# Patient Record
Sex: Female | Born: 1955 | Race: White | Hispanic: No | Marital: Single
Health system: Southern US, Community
[De-identification: ages and names within clinical notes are randomized; demographics above are authoritative.]

## PROBLEM LIST (undated history)

## (undated) HISTORY — PX: BREAST BIOPSY: SHX20

---

## 2011-11-08 ENCOUNTER — Inpatient Hospital Stay: Payer: Self-pay | Admitting: Internal Medicine

## 2011-11-08 ENCOUNTER — Ambulatory Visit: Payer: Self-pay | Admitting: Internal Medicine

## 2011-11-08 LAB — BASIC METABOLIC PANEL
Calcium, Total: 8.3 mg/dL — ABNORMAL LOW (ref 8.5–10.1)
Creatinine: 0.5 mg/dL — ABNORMAL LOW (ref 0.60–1.30)
EGFR (Non-African Amer.): >60
Glucose: 93 mg/dL (ref 65–99)
Potassium: 4 mmol/L (ref 3.5–5.1)
Sodium: 135 mmol/L — ABNORMAL LOW (ref 136–145)

## 2011-11-08 LAB — CBC WITH DIFFERENTIAL/PLATELET
Basophil #: 0 10*3/uL (ref 0.0–0.1)
Eosinophil #: 0 10*3/uL (ref 0.0–0.7)
HGB: 14.5 g/dL (ref 12.0–16.0)
Lymphocyte #: 1.7 10*3/uL (ref 1.0–3.6)
Lymphocyte %: 26.9 %
MCHC: 33 g/dL (ref 32.0–36.0)
Neutrophil #: 3.6 10*3/uL (ref 1.4–6.5)
Platelet: 174 10*3/uL (ref 150–440)
RDW: 13 % (ref 11.5–14.5)
WBC: 6.4 10*3/uL (ref 3.6–11.0)

## 2011-11-08 LAB — PRO B NATRIURETIC PEPTIDE: B-Type Natriuretic Peptide: 2937 pg/mL — ABNORMAL HIGH (ref 0–125)

## 2011-11-08 LAB — TROPONIN I: Troponin-I: 0.09 ng/mL — ABNORMAL HIGH

## 2011-11-09 LAB — CK TOTAL AND CKMB (NOT AT ARMC)
CK, Total: 44 U/L (ref 21–215)
CK, Total: 37 U/L (ref 21–215)
CK-MB: 1.4 ng/mL (ref 0.5–3.6)

## 2011-11-09 LAB — TROPONIN I: Troponin-I: 0.03 ng/mL

## 2011-11-12 LAB — EXPECTORATED SPUTUM ASSESSMENT W REFEX TO RESP CULTURE

## 2011-11-13 LAB — CULTURE, BLOOD (SINGLE)

## 2013-02-19 ENCOUNTER — Ambulatory Visit: Payer: Self-pay | Admitting: Family Medicine

## 2014-06-25 ENCOUNTER — Ambulatory Visit: Payer: Self-pay | Admitting: Family Medicine

## 2014-07-16 NOTE — H&P (Signed)
PATIENT NAME:  Shari Merritt, Shari Merritt MR#:  [id number] DATE OF BIRTH:  09/11/1955  DATE OF ADMISSION:  11/08/2011  REFERRING PHYSICIAN: ER physician, Dr. Shaune PollackLord  PRIMARY CARE PHYSICIAN: None  CHIEF COMPLAINT: Shortness of breath.   HISTORY OF PRESENT ILLNESS: The patient is a 59 year old female with extensive history of smoking, no other significant past medical history, who does not have a primary care physician and presents with shortness of breath for the past couple of days. She reports that she has had a cold for several weeks and recently she started having cough, bringing up yellow expectoration. She reports subjective fevers, denies any chest pain or pleuritic-type chest pain. 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 She felt that she had an infection. Therefore, she came to the Emergency Room.   ALLERGIES: Codeine, shellfish. The patient reports significant allergy to shellfish and describes an anaphylactic reaction.   MEDICATIONS:  None.  PAST MEDICAL HISTORY:  None.  PAST SURGICAL HISTORY:  Lumpectomy.   SOCIAL HISTORY: She has smoked 1 pack per day for more than 30 years. Denies any alcohol or drug abuse. She lives with her daughter and sister.   FAMILY HISTORY: Mother had deep vein thrombosis, heart problems, and died of a stroke. Father had lung cancer and skin cancer.   REVIEW OF SYSTEMS: CONSTITUTIONAL: Reports subjective fevers. Denies any fatigue or weakness. EYES: Denies any blurred or double vision. ENT: Denies any tinnitus or ear pain.  RESPIRATORY: Reports cough, shortness of breath.  Denies having chest pain, palpitations, or syncope. GASTROINTESTINAL: Denies any nausea, vomiting, diarrhea, or abdominal pain.  GENITOURINARY: Denies any dysuria or hematuria.  ENDO:  Denies any polyuria or nocturia. HEME/LYMPH: Denies any anemia or easy bruisability. INTEGUMENT: She denies any acne or rash.   MUSCULOSKELETAL: Denies any swelling or gout. NEUROLOGICAL: Denies any numbness or weakness.  PSYCH: Denies any anxiety or depression.   PHYSICAL EXAMINATION:  VITAL SIGNS: Temperature 97.9, heart rate 106, respiratory rate 24, blood pressure 100/64, pulse oximetry 81% on room air.   GENERAL: The patient is a 59 year old Caucasian female, thin built, sitting comfortably in bed, not in acute distress.   HEAD: Atraumatic, normocephalic.   EYES: There is no pallor, icterus, or cyanosis. Pupils equal, round, and reactive to light and accommodation. Extraocular movements intact.   ENT: Wet mucous membranes. No oropharyngeal erythema or thrush.   NECK: Supple. No masses. No JVD. No thyromegaly or lymphadenopathy.   CHEST WALL: No tenderness to palpation. Not using accessory muscles of respiration. No intercostal muscle retractions.   LUNGS: Bilaterally clear to auscultation. Reduced breath sounds. No wheezing or rhonchi. Right-sided basal crepitus.   CARDIOVASCULAR: S1, S2 regular. No murmur, rubs, or gallops.   ABDOMEN: Soft, nontender, nondistended. No guarding or rigidity. No organomegaly. Normal bowel sounds.   SKIN: No rashes or lesions.  PERIPHERIES: No pedal edema. 2+ pedal pulses. No calf tenderness.   MUSCULOSKELETAL: No cyanosis or clubbing.   NEUROLOGIC: Awake, alert, oriented times three. Nonfocal neurological exam. Cranial nerves grossly intact.   PSYCH: Normal mood and affect.   LABORATORY, DIAGNOSTIC, AND RADIOLOGICAL DATA: CT of the chest shows extensive emphysematous changes in both lungs, patchy interstitial density inferiorly in the right middle lobe and the right lower lobe. Findings suggest interstitial type pneumonia. No evidence of congestive heart failure or bulky lymphadenopathy. ABG showed pH of 7.38, CO2 65, O2 53. CBC normal. Complete metabolic panel normal  other than sodium of 135, CO2 35. Troponin 0.09. BNP 2937.   ASSESSMENT AND PLAN: 59 year old female  with extensive history of smoking, no primary care physician, presents with shortness of breath and found to be hypoxic.  1. Possible right-sided pneumonia: The patient reports subjective fevers at home although she is afebrile currently with normal white count. She reports cough with yellow expectoration. We will obtain blood and sputum cultures. Start on empiric antibiotics, nebulizer treatments, and p.r.n. antitussives.    2. Hypoxia, hypercarbia: The patient's CT chest shows extensive emphysematous changes. She has an extensive history of smoking. She has metabolic alkalosis, likely due to chronic respiratory acidosis, which is compensated since her pH is normal. She may have chronic hypoxia due to emphysema. We will provide oxygen supplementation and try and wean off as tolerated.  3. Emphysema seen on CT chest:  The patient does have bilaterally reduced breath sounds. She has an extensive history of smoking.  She will benefit from outpatient pulmonary function tests.  4. Elevated troponin: The patient denies any chest pain or having cardiac problems in the past. We will monitor on telemetry, check serial cardiac enzymes, start on aspirin, and obtain echo.  5. History of deep vein thrombosis: The patient reports that she had a deep vein thrombosis more than 20 years ago when she was on oral contraceptive pills.  She cannot get IV contrast since she has a severe shellfish allergy.  Clinically there is no evidence of any deep vein thrombosis. We will obtain a V/Q scan to rule out pulmonary embolism given severe hypoxia on presentation.  6. Elevated BNP: The patient is clinically not showing any signs of fluid overload/congestive heart failure. We will obtain a 2-D echo.  7. History of smoking: The patient was counseled about cessation for more than three minutes. We will provide her with a nicotine patch.  Discussed with the ED physician, discussed with the patient and her family the plan of care and  management.   TIME SPENT: 75 minutes.    ____________________________ Darrick Meigs, MD sp:bjt D: 11/08/2011 19:30:40 ET T: 11/09/2011 07:56:04 ET JOB#: [id number]  cc: Darrick Meigs, MD, <Dictator> Darrick Meigs MD ELECTRONICALLY SIGNED 11/10/2011 7:07

## 2014-07-16 NOTE — Discharge Summary (Signed)
PATIENT NAME:  Shari Merritt, Shari Merritt MR#:  [id number] DATE OF BIRTH:  1956-01-14  DATE OF ADMISSION:  11/08/2011 DATE OF DISCHARGE:  11/11/2011  DIAGNOSES: 1. Systemic inflammatory response syndrome, possibly due to right-sided pneumonia. 2. Likely chronic obstructive pulmonary disease with hypoxia and hypercarbia.  3. Elevated troponin due to demand ischemia.  4. Remote history of deep venous thrombosis on oral contraceptive medications.  5. Smoking.   DISPOSITION: The patient is being discharged home. Follow up with PCP in 1 to 2 weeks after discharge.   DIET: Regular.   ACTIVITY: As tolerated.   DISCHARGE MEDICATIONS:  1. Oxygen 3L via nasal cannula. 2. Prednisone taper. 3. Aspirin 81 mg daily.  4. Albuterol 90 mcg inhaled q.6 hours p.r.n.  5. DuoNeb q.6 hours p.r.n.  6. Advair 250/50 one puff b.i.d.  7. Spiriva 18 mcg inhaled daily.  8. Doxycycline 100 mg b.i.d. for 7 days. 9. Robitussin-DM 10 mL q.6h. p.r.n. cough.   RESULTS: V/Q scan: Low probability for PE. CT chest: Extensive emphysematous changes in both lungs. There is patchy interstitial density inferiorly in the right middle lobe and in the right lower lobe, findings suggest interstitial type pneumonia or other process, no evidence of congestive heart failure, lymphadenopathy. No pleural or pericardial effusion. Echocardiogram showed a left ventricular systolic function is normal, ejection fraction more than 55%. The right ventricle is moderately dilated. The right atrium is mildly dilated, impaired LV relaxation, mitral valve is grossly normal. Tricuspid valve is not visualized.  Trace TR. No pericardial effusion. CBC normal. BNP 2937. Glucose 93, BUN 10, creatinine 0.50, sodium 135, potassium 4, chloride 96, CO2 of 35. Cardiac enzymes ranged from 0.29 to 0.03.   HOSPITAL COURSE: The patient is a 59 year old female with no medical history other than extensive history of smoking who presented with subjective fevers, tachycardia,  and shortness of breath. She was found to be hypoxic. She was admitted with a diagnosis of SIRS due to possible right-sided pneumonia. During the hospitalization she remained afebrile with normal white count. Her blood cultures have been negative so far. Sputum culture only showed WBC. She was treated with IV antibiotics and hospitalization and has been switched to oral antibiotics by the time of discharge. The patient likely has chronic obstructive pulmonary disease with hypoxia and hypercarbia. There was extensive emphysematous changes seen on CT of the chest. She also has metabolic alkalosis, likely due to chronic respiratory acidosis, which is compensated since the pH was normal. She will benefit from outpatient PFTs. She was treated with nebulizer treatment, Advair, and Spiriva. She continued to be hypoxic off oxygen and therefore home oxygen has been arranged for her. She has been aggressively counseled about smoking cessation. She had very minimally elevated troponin on presentation. She has no history of cardiac problems in the past. Her repeat cardiac enzymes were normal. This was felt to be due to demand ischemia from her respiratory distress. Her ejection fraction is normal. She was initially started on beta blocker and ACE inhibitor also but they had to discontinued due to low blood pressure. She had a remote history of deep venous thrombosis while she was on oral contraceptive medications. Her V/Q scan was low probability for PE. She had elevated BNP but had no clinical signs of congestive heart failure. She is being discharged home in a stable condition.   TIME SPENT: 45 minutes.    ____________________________ Darrick Meigs, MD sp:vtd D: 11/11/2011 15:47:33 ET T: 11/12/2011 12:07:24 ET JOB#: [id number]  cc: Darrick Meigs, MD, <Dictator>  Darrick MeigsSANGEETA Klare Criss MD ELECTRONICALLY SIGNED 11/12/2011 15:02

## 2018-01-04 ENCOUNTER — Other Ambulatory Visit: Payer: Self-pay | Admitting: Family Medicine

## 2018-01-04 DIAGNOSIS — Z1231 Encounter for screening mammogram for malignant neoplasm of breast: Secondary | ICD-10-CM

## 2018-01-18 ENCOUNTER — Encounter (INDEPENDENT_AMBULATORY_CARE_PROVIDER_SITE_OTHER): Payer: Self-pay

## 2018-01-18 ENCOUNTER — Ambulatory Visit
Admission: RE | Admit: 2018-01-18 | Discharge: 2018-01-18 | Disposition: A | Discharge status: Home / Self Care | Payer: BC Managed Care – PPO | Source: Ambulatory Visit | Attending: Family Medicine | Admitting: Family Medicine

## 2018-01-18 DIAGNOSIS — Z1231 Encounter for screening mammogram for malignant neoplasm of breast: Secondary | ICD-10-CM | POA: Insufficient documentation

## 2020-02-05 ENCOUNTER — Other Ambulatory Visit: Payer: Self-pay

## 2020-02-05 DIAGNOSIS — Z1231 Encounter for screening mammogram for malignant neoplasm of breast: Secondary | ICD-10-CM

## 2020-03-04 ENCOUNTER — Ambulatory Visit
Admission: RE | Admit: 2020-03-04 | Discharge: 2020-03-04 | Disposition: A | Discharge status: Home / Self Care | Payer: BC Managed Care – PPO | Source: Ambulatory Visit

## 2020-03-04 ENCOUNTER — Other Ambulatory Visit: Payer: Self-pay

## 2020-03-04 DIAGNOSIS — Z1231 Encounter for screening mammogram for malignant neoplasm of breast: Secondary | ICD-10-CM | POA: Insufficient documentation

## 2021-07-27 DEATH — deceased

## 2022-03-29 IMAGING — MG DIGITAL SCREENING BILAT W/ TOMO W/ CAD
8 series · 8 of 24 positions shown · non-contrast
Comparison: Previous exam(s).

CLINICAL DATA: Screening.

EXAM:
DIGITAL SCREENING BILATERAL MAMMOGRAM WITH TOMO AND CAD

[R CC synth-2D]
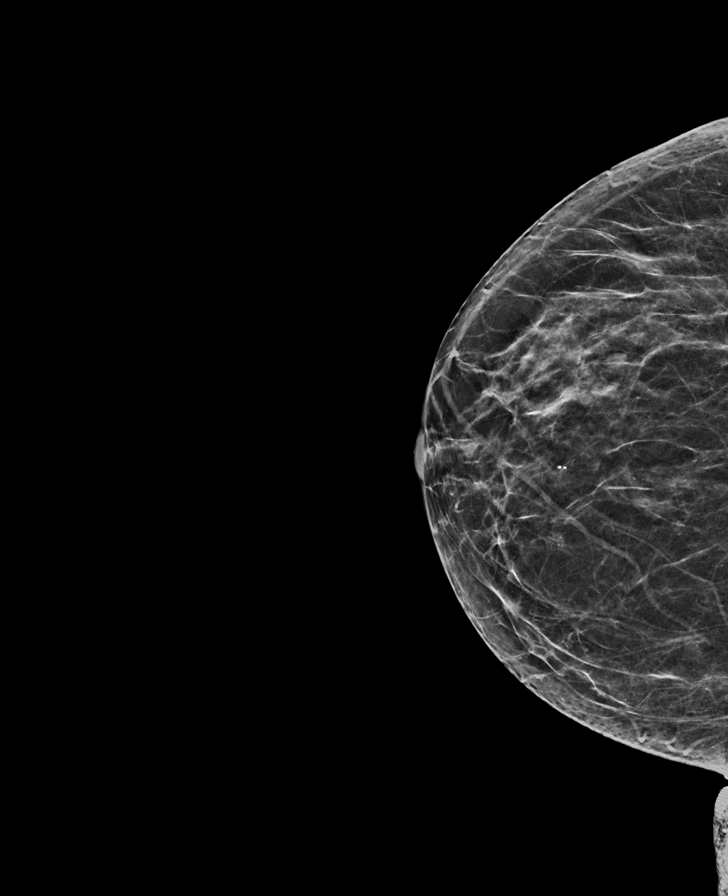

[R MLO synth-2D]
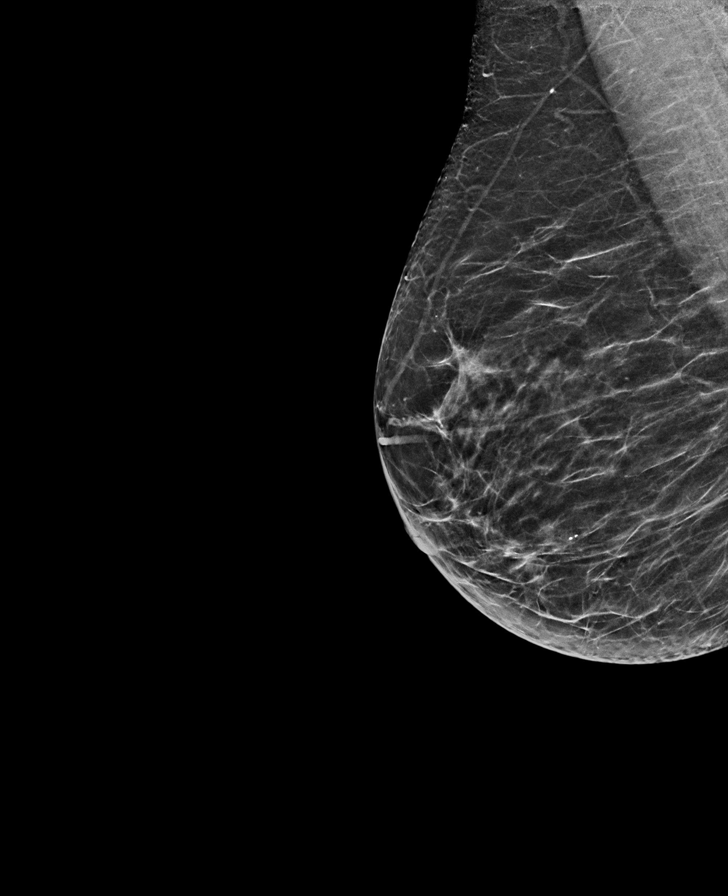

[L MLO synth-2D]
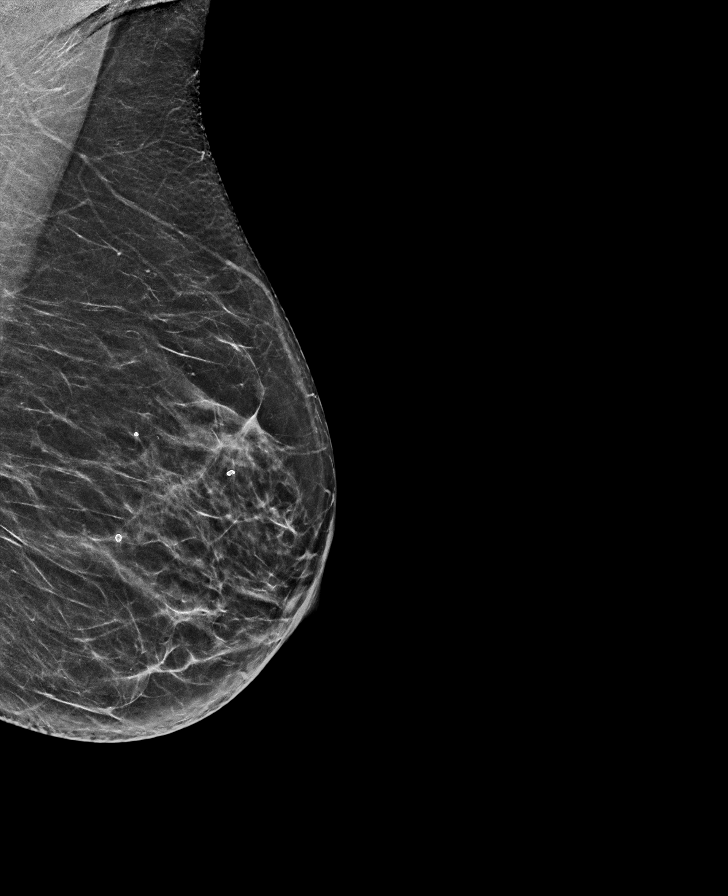

[L CC synth-2D]
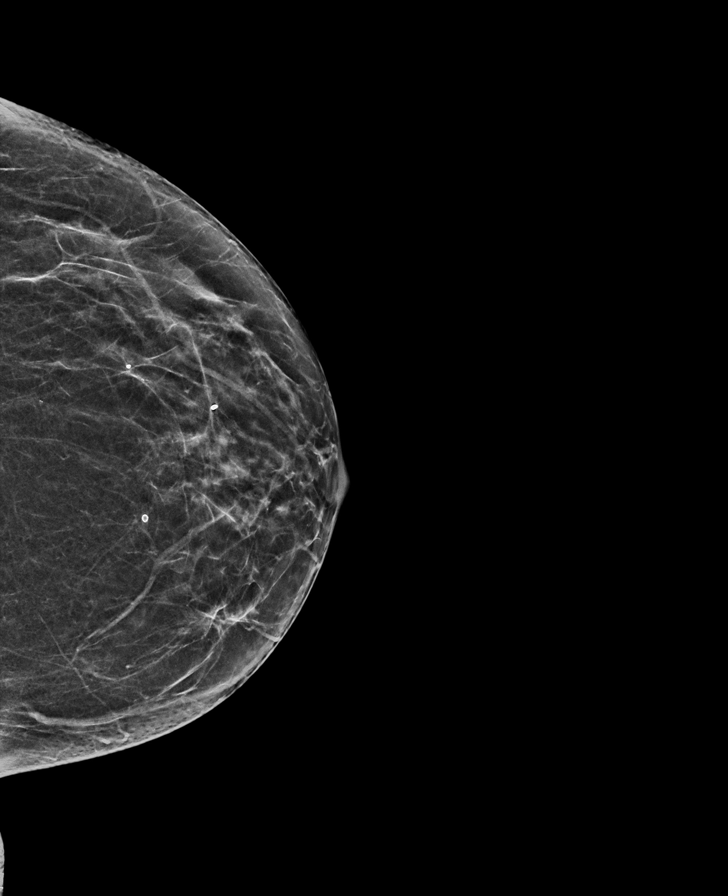

[R MLO tomo · tomo slice 31/62.0]
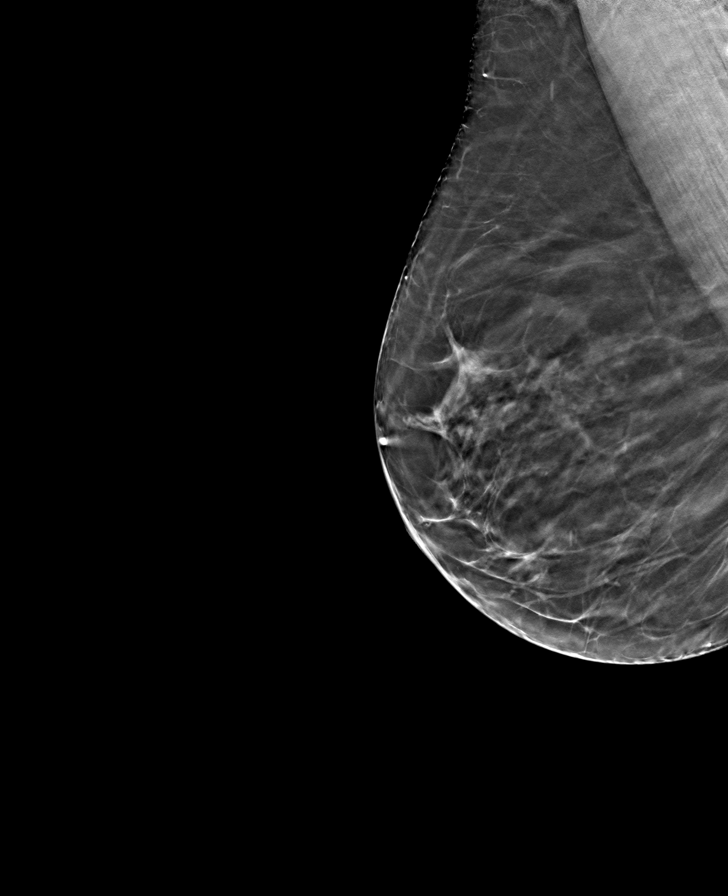

[R CC tomo · tomo slice 28/55.0]
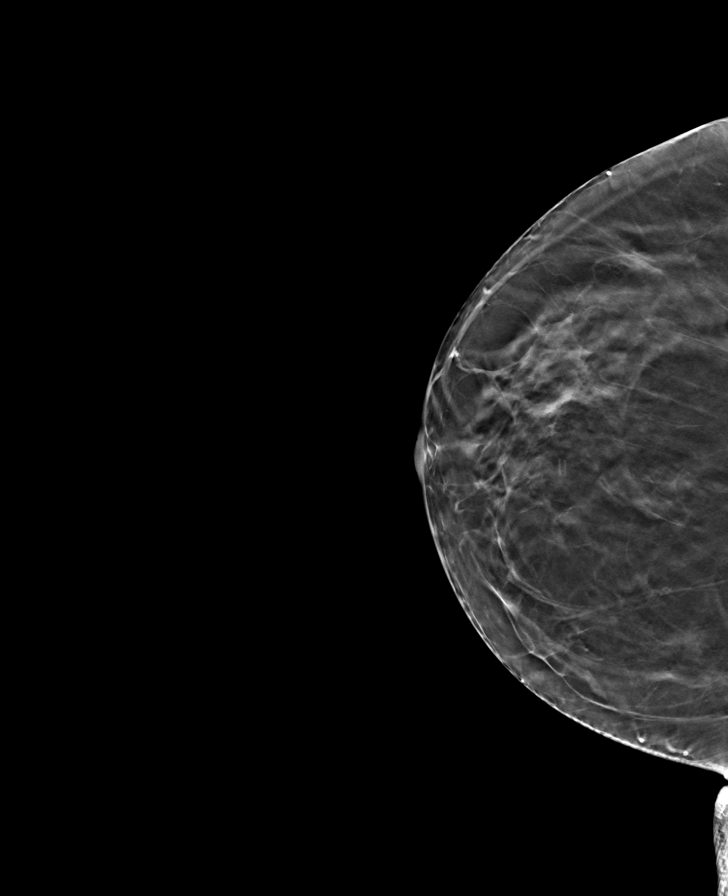

[L MLO tomo · tomo slice 31/62.0]
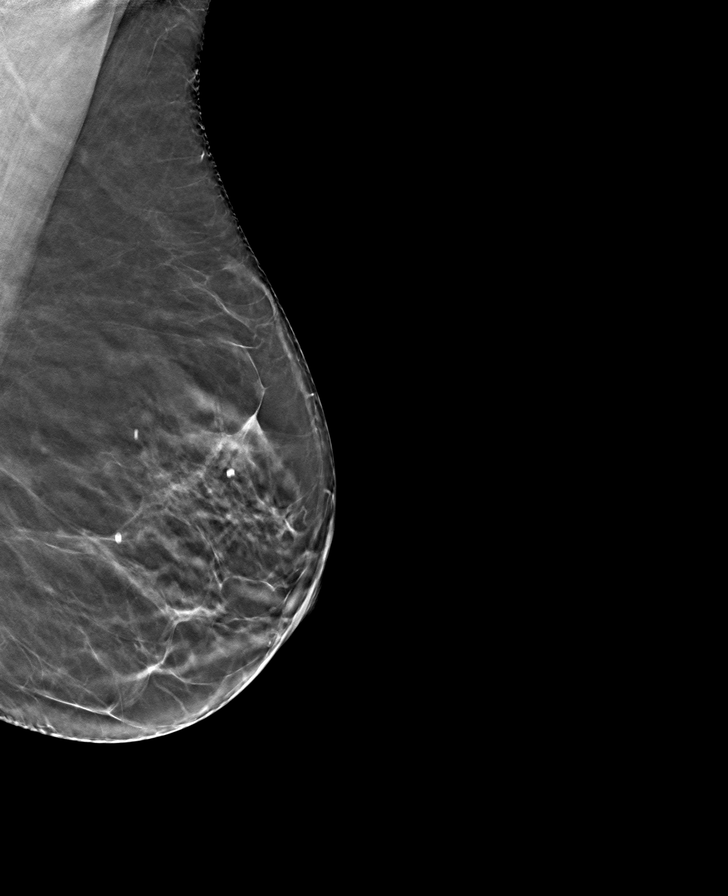

[L CC tomo · tomo slice 29/57.0]
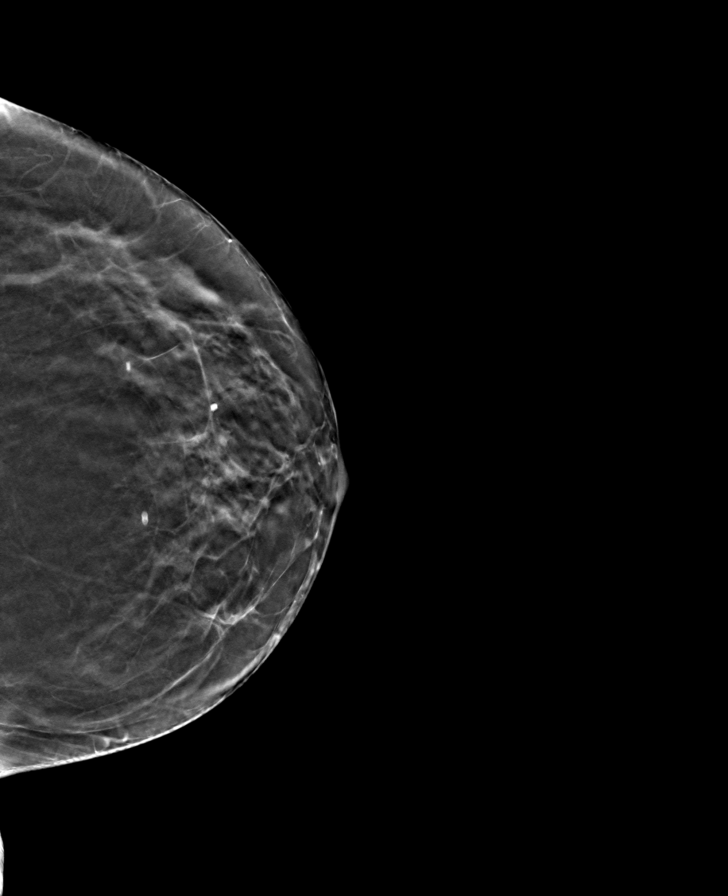

[8 of 24 positions shown; findings below may reference images not displayed]

ACR Breast Density Category b: There are scattered areas of
fibroglandular density.
FINDINGS: There are no findings suspicious for malignancy. Images were
processed with CAD.
IMPRESSION: No mammographic evidence of malignancy. A result letter of this
screening mammogram will be mailed directly to the patient.

RECOMMENDATION:
Screening mammogram in one year. (Code:CN-[id number])

BI-RADS CATEGORY  1: Negative.
